# Patient Record
Sex: Male | Born: 1990
Health system: Southern US, Community
[De-identification: ages and names within clinical notes are randomized; demographics above are authoritative.]

---
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
## 2016-02-24 DIAGNOSIS — H5213 Myopia, bilateral: Secondary | ICD-10-CM | POA: Diagnosis not present

## 2016-04-30 DIAGNOSIS — F900 Attention-deficit hyperactivity disorder, predominantly inattentive type: Secondary | ICD-10-CM | POA: Diagnosis not present

## 2016-05-12 DIAGNOSIS — F900 Attention-deficit hyperactivity disorder, predominantly inattentive type: Secondary | ICD-10-CM | POA: Diagnosis not present

## 2016-06-10 DIAGNOSIS — F900 Attention-deficit hyperactivity disorder, predominantly inattentive type: Secondary | ICD-10-CM | POA: Diagnosis not present

## 2016-10-15 DIAGNOSIS — F900 Attention-deficit hyperactivity disorder, predominantly inattentive type: Secondary | ICD-10-CM | POA: Diagnosis not present

## 2017-04-27 DIAGNOSIS — F410 Panic disorder [episodic paroxysmal anxiety] without agoraphobia: Secondary | ICD-10-CM | POA: Diagnosis not present

## 2019-10-11 ENCOUNTER — Ambulatory Visit (HOSPITAL_COMMUNITY)
Admission: AD | Admit: 2019-10-11 | Discharge: 2019-10-11 | Disposition: A | Discharge status: Home / Self Care | Payer: Self-pay | Attending: Psychiatry | Admitting: Psychiatry

## 2019-10-12 ENCOUNTER — Other Ambulatory Visit: Payer: Self-pay

## 2019-10-12 ENCOUNTER — Ambulatory Visit (HOSPITAL_COMMUNITY)
Admission: EM | Admit: 2019-10-12 | Discharge: 2019-10-12 | Disposition: A | Discharge status: Home / Self Care | Payer: No Payment, Other | Attending: Family | Admitting: Family

## 2019-10-12 DIAGNOSIS — R45851 Suicidal ideations: Secondary | ICD-10-CM | POA: Insufficient documentation

## 2019-10-12 DIAGNOSIS — F4323 Adjustment disorder with mixed anxiety and depressed mood: Secondary | ICD-10-CM

## 2019-10-12 NOTE — H&P (Signed)
Behavioral Health Medical Screening Exam  Kori A Frericks is an 29 y.o. male.  Patient left prior to evaluation by TTS or provider.  Jackelyn Poling, NP 10/12/2019, 2:33 AM

## 2019-10-12 NOTE — ED Notes (Signed)
Pt belongings in locker 3.  

## 2019-10-12 NOTE — ED Notes (Signed)
Patient discharged home. Patient denies SI/HI at discharge. AVS reviewed with patient and written copy given to patient and he verbalized understanding. Patient belongings returned to him. Patient escorted to lobby.

## 2019-10-12 NOTE — BH Assessment (Signed)
Comprehensive Clinical Assessment (CCA) Note  10/12/2019 Grant Alvarez [id number]   Patient is a 29 year old male presenting voluntarily to [facility] for assessment. Patient reports increasing depression since he and his wife separated 2 days ago. Patient reports a history of pornography addiction that eventually led to an online sexual relationship with another woman. Patient's wife told him to leave and he moved back in with his parents. He reports current passive SI without any intent or plan. He denies HI/AVH, substance use, or trauma history. Patient states he feels safe to be discharged. He denies having access to weapons in the home. Patient gives verbal consent for TTS to contact his mother, Grant Alvarez, at [phone] for collateral information.  This counselor attempted to reach collateral but she did not answer. HIPPA compliant voicemail left.   Visit Diagnosis:   F43.21 Adjustment disorder, with depressed mood  Per Malachy Chamber, PMHNP this patient does not meet in patient criteria and is psych cleared for d/c. Patient referred to [facility] for outpatient therapy and psychiatry.   CCA Screening, Triage and Referral (STR)  Patient Reported Information How did you hear about [place]? Family/Friend  Referral name: No data recorded Referral phone number: No data recorded  Whom do you see for routine medical problems? I don't have a doctor   What Is the Reason for Your Visit/Call Today? dispair, not wanting to be alive  How Long Has This Been Causing You Problems? <Week  What Do You Feel Would Help You the Most Today? Medication;Therapy   Have You Recently Been in Any Inpatient Treatment (Hospital/Detox/Crisis Center/28-Day Program)? No  Name/Location of Program/Hospital:No data recorded How Long Were You There? No data recorded When Were You Discharged? No data recorded  Have You Ever Received Services From [facility] Before? No  Who Do You See at [facility]? No data  recorded  Have You Recently Had Any Thoughts About Hurting Yourself? Yes  Are You Planning to Commit Suicide/Harm Yourself At This time? No   Have you Recently Had Thoughts About Hurting Someone [name]? No  Explanation: No data recorded  Have You Used Any Alcohol or Drugs in the Past 24 Hours? No  How Long Ago Did You Use Drugs or Alcohol? No data recorded What Did You Use and How Much? No data recorded  Do You Currently Have a Therapist/Psychiatrist? Yes  Name of Therapist/Psychiatrist: Vesta Mixer in [place]   Have You Been Recently Discharged From Any Public relations account executive or Programs? No  Explanation of Discharge From Practice/Program: No data recorded    CCA Screening Triage Referral Assessment Type of Contact: Face-to-Face  Is this Initial or Reassessment? No data recorded Date Telepsych consult ordered in [facility]:  No data recorded Time Telepsych consult ordered in [facility]:  No data recorded  Patient Reported Information Reviewed? Yes  Patient Left Without Being Seen? No data recorded Reason for Not Completing Assessment: No data recorded  Collateral Involvement: No data recorded  Does Patient Have a Court Appointed Legal Guardian? No data recorded Name and Contact of Legal Guardian: No data recorded If Minor and Not Living with Parent(s), Who has Custody? No data recorded Is CPS involved or ever been involved? Never  Is APS involved or ever been involved? Never   Patient Determined To Be At Risk for Harm To Self or Others Based on Review of Patient Reported Information or Presenting Complaint? No  Method: No data recorded Availability of Means: No data recorded Intent: No data recorded Notification Required: No data recorded  Additional Information for Danger to Others Potential: No data recorded Additional Comments for Danger to Others Potential: No data recorded Are There Guns or Other Weapons in Your Home? No data recorded Types of Guns/Weapons: No data recorded Are  These Weapons Safely Secured?                            No data recorded Who Could Verify You Are Able To Have These Secured: No data recorded Do You Have any Outstanding Charges, Pending Court Dates, Parole/Probation? No data recorded Contacted To Inform of Risk of Harm To Self or Others: No data recorded  Location of Assessment: [facility]   Does Patient Present under Involuntary Commitment? No  IVC Papers Initial File Date: No data recorded  [place] of Residence: [place]   Patient Currently Receiving the Following Services: Medication Management   Determination of Need: Routine (7 days)   Options For Referral: Medication Management;Outpatient Therapy     CCA Biopsychosocial  Intake/Chief Complaint:  CCA Intake With Chief Complaint CCA Part Two Date: 10/12/19 CCA Part Two Time: 1715 Chief Complaint/Presenting Problem: NA Patient's Currently Reported Symptoms/Problems: NA Individual's Strengths: NA Individual's Preferences: NA Individual's Abilities: NA Type of Services Patient Feels Are Needed: NA Initial Clinical Notes/Concerns: NA  Mental Health Symptoms Depression:  Depression: Change in energy/activity, Difficulty Concentrating, Fatigue, Hopelessness, Increase/decrease in appetite, Irritability, Sleep (too much or little), Tearfulness, Weight gain/loss, Worthlessness, Duration of symptoms less than two weeks  Mania:  Mania: None  Anxiety:   Anxiety: None  Psychosis:  Psychosis: None  Trauma:  Trauma: None  Obsessions:  Obsessions: None  Compulsions:  Compulsions: None  Inattention:  Inattention: None  Hyperactivity/Impulsivity:  Hyperactivity/Impulsivity: N/A  Oppositional/Defiant Behaviors:  Oppositional/Defiant Behaviors: None  Emotional Irregularity:  Emotional Irregularity: None  Other Mood/Personality Symptoms:      Mental Status Exam Appearance and self-care  Stature:  Stature: Average  Weight:  Weight: Overweight  Clothing:   Clothing: Casual  Grooming:  Grooming: Normal  Cosmetic use:  Cosmetic Use: None  Posture/gait:  Posture/Gait: Slumped  Motor activity:  Motor Activity: Not Remarkable  Sensorium  Attention:  Attention: Normal  Concentration:  Concentration: Normal  Orientation:  Orientation: X5  Recall/memory:  Recall/Memory: Normal  Affect and Mood  Affect:  Affect: Depressed  Mood:  Mood: Depressed  Relating  Eye contact:  Eye Contact: Normal  Facial expression:  Facial Expression: Depressed  Attitude toward examiner:  Attitude Toward Examiner: Cooperative  Thought and Language  Speech flow: Speech Flow: Clear and Coherent  Thought content:  Thought Content: Appropriate to Mood and Circumstances  Preoccupation:  Preoccupations: Suicide  Hallucinations:  Hallucinations: None  Organization:     Company secretary of Knowledge:  Fund of Knowledge: Good  Intelligence:  Intelligence: Average  Abstraction:  Abstraction: Normal  Judgement:  Judgement: Impaired  Reality Testing:  Reality Testing: Distorted  Insight:  Insight: Fair  Decision Making:  Decision Making: Impulsive  Social Functioning  Social Maturity:  Social Maturity: Irresponsible  Social Judgement:  Social Judgement: Normal  Stress  Stressors:  Stressors: Family conflict, Grief/losses, Surveyor, quantity  Coping Ability:  Coping Ability: Normal  Skill Deficits:  Skill Deficits: Interpersonal  Supports:  Supports: Family, Friends/Service system     Religion: Religion/Spirituality Are You A Religious Person?: No  Leisure/Recreation: Leisure / Recreation Do You Have Hobbies?: No  Exercise/Diet: Exercise/Diet Do You Exercise?: No Have You Gained or Lost A Significant  Amount of Weight in the Past Six Months?: No Do You Follow a Special Diet?: No Do You Have Any Trouble Sleeping?: Yes Explanation of Sleeping Difficulties: reports prior to last night little to no sleep   CCA Employment/Education  Employment/Work  Situation: Employment / Work Situation Employment situation: Employed 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[employer] How long has patient been employed?: [employer] Patient's job has been impacted by current illness: No What is the longest time patient has a held a job?: [employer] Where was the patient employed at that time?: [employer]  Education: Education Is Patient Currently Attending School?: Yes School Currently Attending: [employer] Last Grade Completed: 12 Name of High School: [employer] Did Garment/textile technologistYou Graduate From [employer]?: Yes Did Theme park managerYou Attend College?: Yes What Type of College Degree Do you Have?: in school for paralegal degree Did You Attend Graduate School?: No Did You Have An Individualized Education Program (IIEP): No Did You Have Any Difficulty At School?: No Patient's Education Has Been Impacted by Current Illness: No   CCA Family/Childhood History  Family and Relationship History: Family history Marital status: Divorced Divorced, when?: 3 days ago What types of issues is patient dealing with in the relationship?: pornography addiction Additional relationship information: together 9 years Are you sexually active?: No What is your sexual orientation?: heterosexual Has your sexual activity been affected by drugs, alcohol, medication, or emotional stress?: NA Does patient have children?: No  Childhood History:  Childhood History By whom was/is the patient raised?: Both parents Additional childhood history information: parents divorced Description of patient's relationship with caregiver when they were a child: supportive Patient's description of current relationship with people who raised him/her: continue to be supportive, currently staying with his mother How were you disciplined when you got in trouble as a child/adolescent?: NA Does patient have siblings?: No Did patient suffer any verbal/emotional/physical/sexual abuse as a child?: No Did patient suffer from severe childhood  neglect?: No Has patient ever been sexually abused/assaulted/raped as an adolescent or adult?: No Was the patient ever a victim of a crime or a disaster?: No Witnessed domestic violence?: No Has patient been affected by domestic violence as an adult?: No  Child/Adolescent Assessment:     CCA Substance Use  Alcohol/Drug Use: Alcohol / Drug Use Pain Medications: see MAR Prescriptions: see MAR Over the Counter: see MAR History of alcohol / drug use?: No history of alcohol / drug abuse                         ASAM's:  Six Dimensions of Multidimensional Assessment  Dimension 1:  Acute Intoxication and/or Withdrawal Potential:      Dimension 2:  Biomedical Conditions and Complications:      Dimension 3:  Emotional, Behavioral, or Cognitive Conditions and Complications:     Dimension 4:  Readiness to Change:     Dimension 5:  Relapse, Continued use, or Continued Problem Potential:     Dimension 6:  Recovery/Living Environment:     ASAM Severity Score:    ASAM Recommended Level of Treatment:     Substance use Disorder (SUD)    Recommendations for Services/Supports/Treatments:    DSM5 Diagnoses: There are no problems to display for this patient.   Patient Centered Plan: Patient is on the following Treatment Plan(s):     Referrals to Alternative Service(s): Referred to Alternative Service(s):   Place:   Date:   Time:    Referred to Alternative Service(s):   Place:  Date:   Time:    Referred to Alternative Service(s):   Place:   Date:   Time:    Referred to Alternative Service(s):   Place:   Date:   Time:     Celedonio Miyamoto

## 2019-10-12 NOTE — ED Provider Notes (Signed)
Behavioral Health Medical Screening Exam  Grant Alvarez is a 29 y.o. male who presents with passive, fleeting thoughts of suicidal ideations. He reports recent seperation from his wife due to his porn addiction. He feels ashame and would like to work on his behaviors. He is currently under the care of a psych np at [facility], who is prescribing him Prozac 20mg  po daily. He is also taking Hydroxyzine 25mg  po TID prn for anxiety. He denies active suicidal thoughts, does not have a plan, never attempted suicide and never tried to harm himself. He is able to contract for safety, and feels as though he can still trust himself to not do any harm. He feels outpatient will better suit him.   Total Time spent with patient: 30 minutes  Psychiatric Specialty Exam  Presentation  General Appearance:Appropriate for Environment;Fairly Groomed  Eye Contact:Minimal  Speech:Clear and Coherent;Normal Rate  Speech Volume:Normal  Handedness:Right   Mood and Affect  Mood:Depressed  Affect:Appropriate;Congruent   Thought Process  Thought Processes:Coherent  Descriptions of Associations:Intact  Orientation:Full (Time, Place and Person)  Thought Content:Logical  Hallucinations:None  Ideas of Reference:None  Suicidal Thoughts:No  Homicidal Thoughts:No   Sensorium  Memory:Immediate Fair;Recent Fair;Remote Fair  Judgment:Fair  Insight:Fair   Executive Functions  Concentration:Fair  Attention Span:Fair  Recall:Good  Fund of Knowledge:Good  Language:Good   Psychomotor Activity  Psychomotor Activity:Normal   Assets  Assets:Desire for Improvement;Communication Skills;Housing;Physical Health;Resilience   Sleep  Sleep:Fair  Number of hours: No data recorded  Physical Exam: Physical Exam ROS Blood pressure (!) 134/94, pulse 90, temperature 97.7 F (36.5 C), temperature source Tympanic, resp. rate 18, height 5\' 11"  (1.803 m), weight 215 lb (97.5 kg), SpO2 98 %. Body  mass index is 29.99 kg/m.  Musculoskeletal: Strength & Muscle Tone: within normal limits Gait & Station: normal Patient leans: Right   Recommendations:  Based on my evaluation the patient does not appear to have an emergency medical condition. WIll discharg home at this time. Information included for [facility] as their offices are now closed at this time.  , FNP 10/12/2019, 5:49 PM

## 2019-10-12 NOTE — Discharge Instructions (Signed)
Patient referred to [facility] for outpatient therapy and psychiatry.

## 2019-11-07 ENCOUNTER — Emergency Department (HOSPITAL_BASED_OUTPATIENT_CLINIC_OR_DEPARTMENT_OTHER)
Admission: EM | Admit: 2019-11-07 | Discharge: 2019-11-07 | Disposition: A | Discharge status: Home / Self Care | Payer: No Typology Code available for payment source | Attending: Emergency Medicine | Admitting: Emergency Medicine

## 2019-11-07 ENCOUNTER — Emergency Department (HOSPITAL_BASED_OUTPATIENT_CLINIC_OR_DEPARTMENT_OTHER): Payer: No Typology Code available for payment source

## 2019-11-07 ENCOUNTER — Other Ambulatory Visit: Payer: Self-pay

## 2019-11-07 ENCOUNTER — Encounter (HOSPITAL_BASED_OUTPATIENT_CLINIC_OR_DEPARTMENT_OTHER): Payer: Self-pay

## 2019-11-07 DIAGNOSIS — W010XXA Fall on same level from slipping, tripping and stumbling without subsequent striking against object, initial encounter: Secondary | ICD-10-CM | POA: Diagnosis not present

## 2019-11-07 DIAGNOSIS — S43014A Anterior dislocation of right humerus, initial encounter: Secondary | ICD-10-CM | POA: Diagnosis not present

## 2019-11-07 DIAGNOSIS — S4991XA Unspecified injury of right shoulder and upper arm, initial encounter: Secondary | ICD-10-CM | POA: Diagnosis present

## 2019-11-07 MED ORDER — PROPOFOL 10 MG/ML IV BOLUS
0.5000 mg/kg | Freq: Once | INTRAVENOUS | Status: AC
  Administered 2019-11-07: 60 mg via INTRAVENOUS
  Filled 2019-11-07: qty 20

## 2019-11-07 NOTE — ED Notes (Signed)
Pt alert, shoulder immobilizer in place. Skin warm and dry, taking po water. Family member at bedside.

## 2019-11-07 NOTE — ED Provider Notes (Signed)
[facility] EMERGENCY DEPARTMENT Provider Note   CSN: [id number] Arrival date & time: 11/07/19  1557     History Chief Complaint  Patient presents with  . Shoulder Injury    Grant Alvarez is a 29 y.o. male.  Patient c/o slip and near fall ~ 1 hr ago, with injury to right shoulder. States floor was wet and slippery, and was about to fall, tried to reach out right arm to brace on counter, and felt pain in right shoulder. Denies prior right shoulder injury. Pain acute onset, constant, moderate, persistent, worse w movement, non radiating. Denies any other pain or injury. No elbow or wrist pain. No head injury or headache. No neck or back pain. No cp or sob. No abd pain. Skin is intact.  No associated numbness or weakness. Right hand dom.   The history is provided by the patient.  Shoulder Injury Pertinent negatives include no chest pain, no abdominal pain, no headaches and no shortness of breath.       History reviewed. No pertinent past medical history.  There are no problems to display for this patient.   History reviewed. No pertinent surgical history.     No family history on file.  Social History   Tobacco Use  . Smoking status: Never Smoker  . Smokeless tobacco: Never Used  Vaping Use  . Vaping Use: Never used  Substance Use Topics  . Alcohol use: Not Currently  . Drug use: Never    Home Medications Prior to Admission medications   Not on File    Allergies    Sulfa antibiotics  Review of Systems   Review of Systems  Constitutional: Negative for fever.  HENT: Negative for nosebleeds.   Eyes: Negative for redness.  Respiratory: Negative for shortness of breath.   Cardiovascular: Negative for chest pain.  Gastrointestinal: Negative for abdominal pain.  Genitourinary: Negative for flank pain.  Musculoskeletal: Negative for back pain and neck pain.  Skin: Negative for wound.  Neurological: Negative for headaches.  Hematological: Does  not bruise/bleed easily.  Psychiatric/Behavioral: Negative for confusion.    Physical Exam Updated Vital Signs BP (!) 143/100 (BP Location: Left Arm)   Pulse 81   Resp 20   Ht 1.803 m (5\' 11" )   Wt 93 kg   SpO2 99%   BMI 28.59 kg/m   Physical Exam Vitals and nursing note reviewed.  Constitutional:      Appearance: Normal appearance. He is well-developed.  HENT:     Head: Atraumatic.     Nose: Nose normal.     Mouth/Throat:     Mouth: Mucous membranes are moist.  Eyes:     General: No scleral icterus.    Conjunctiva/sclera: Conjunctivae normal.  Neck:     Trachea: No tracheal deviation.  Cardiovascular:     Rate and Rhythm: Normal rate.     Pulses: Normal pulses.  Pulmonary:     Effort: Pulmonary effort is normal. No accessory muscle usage or respiratory distress.  Chest:     Chest wall: No tenderness.  Abdominal:     General: There is no distension.     Palpations: Abdomen is soft.     Tenderness: There is no abdominal tenderness.  Genitourinary:    Comments: No cva tenderness. Musculoskeletal:        General: No swelling.     Cervical back: Neck supple. No tenderness.     Comments: CTLS spine, non tender, aligned, no step off. Tenderness right  shoulder. No other focal extremity pain or tenderness. Distal pulses palp, radial pulse 2+. No tenderness at right elbow. No significant sts to arm noted.   Skin:    General: Skin is warm and dry.     Findings: No rash.  Neurological:     Mental Status: He is alert.     Comments: Alert, speech clear. Motor/sens grossly intact. RUE, rad/med/uln fxn, motor and sens intact. Steady gait.   Psychiatric:        Mood and Affect: Mood normal.     ED Results / Procedures / Treatments   Labs (all labs ordered are listed, but only abnormal results are displayed) Labs Reviewed - No data to display  EKG None  Radiology DG Shoulder Right  Result Date: 11/07/2019 CLINICAL DATA:  Fall.  Shoulder dislocation. EXAM: RIGHT  SHOULDER - 2+ VIEW COMPARISON:  None. FINDINGS: AP and scapular views demonstrate anterior inferior dislocation of the humeral head. No complicating fracture identified. IMPRESSION: Anterior inferior dislocation of the right glenohumeral joint. Electronically Signed   By: Jeronimo Greaves M.D.   On: 11/07/2019 16:54   DG Shoulder Right Portable  Result Date: 11/07/2019 CLINICAL DATA:  Postreduction EXAM: PORTABLE RIGHT SHOULDER COMPARISON:  Right shoulder radiographs from earlier today FINDINGS: No residual malalignment at the right glenohumeral joint. No evidence of acromioclavicular separation. No focal osseous lesions. Probable small Hill-Sachs deformity posteriorly in the right humeral head. Otherwise no fracture. No significant arthropathy. No radiopaque foreign body. IMPRESSION: No residual malalignment at the right glenohumeral joint. Probable small Hill-Sachs deformity in the posterior right humeral head. Electronically Signed   By: Delbert Phenix M.D.   On: 11/07/2019 18:32    Procedures .Sedation  Date/Time: 11/07/2019 6:37 PM Performed by: Cathren Laine, MD Authorized by: Cathren Laine, MD   Consent:    Consent obtained:  Verbal and written   Consent given by:  Patient Universal protocol:    Procedure explained and questions answered to patient or proxy's satisfaction: yes     Imaging studies available: yes     Immediately prior to procedure a time out was called: yes     Patient identity confirmation method:  Arm band and verbally with patient Indications:    Procedure performed:  Dislocation reduction   Procedure necessitating sedation performed by:  Physician performing sedation Pre-sedation assessment:    Time since last food or drink:  4 hours   ASA classification: class 1 - normal, healthy patient     Mallampati score:  I - soft palate, uvula, fauces, pillars visible   Pre-sedation assessments completed and reviewed: airway patency, cardiovascular function, hydration status,  mental status, nausea/vomiting, pain level, respiratory function and temperature     Pre-sedation assessment completed:  11/07/2019 5:00 PM Immediate pre-procedure details:    Reassessment: Patient reassessed immediately prior to procedure     Reviewed: vital signs, relevant labs/tests and NPO status     Verified: bag valve mask available, emergency equipment available, intubation equipment available, IV patency confirmed and oxygen available   Procedure details (see MAR for exact dosages):    Preoxygenation:  Nasal cannula   Sedation:  Propofol   Intended level of sedation: deep   Intra-procedure monitoring:  Blood pressure monitoring, cardiac monitor, continuous capnometry, continuous pulse oximetry, frequent LOC assessments and frequent vital sign checks   Intra-procedure events: none     Total Provider sedation time (minutes):  20 Post-procedure details:    Post-sedation assessment completed:  11/07/2019 6:39 PM   Attendance:  Constant attendance by certified staff until patient recovered     Recovery: Patient returned to pre-procedure baseline     Post-sedation assessments completed and reviewed: airway patency, cardiovascular function, hydration status, mental status, nausea/vomiting, pain level and respiratory function     Patient is stable for discharge or admission: yes     Patient tolerance:  Tolerated well, no immediate complications .Ortho Injury Treatment  Date/Time: 11/07/2019 6:40 PM Performed by: Cathren Laine, MD Authorized by: Cathren Laine, MD   Consent:    Consent obtained:  Verbal and written Universal protocol:    Procedure explained and questions answered to patient or proxy's satisfaction: yes     Immediately prior to procedure a time out was called: yes     Patient identity confirmed:  Arm band and verbally with patientInjury location: shoulder Location details: right shoulder Injury type: dislocation Dislocation type: anterior Hill-Sachs deformity:  yes Chronicity: new Pre-procedure neurovascular assessment: neurovascularly intact Pre-procedure distal perfusion: normal Pre-procedure neurological function: normal Pre-procedure range of motion: normal  Patient sedated: Yes. Refer to sedation procedure documentation for details of sedation. Manipulation performed: yes Reduction method: RUE slowly raised above head and then gently lower back into positions with reduction. Reduction successful: yes X-ray confirmed reduction: yes Immobilization: sling Post-procedure neurovascular assessment: post-procedure neurovascularly intact Post-procedure distal perfusion: normal Post-procedure neurological function: normal Post-procedure range of motion: normal Patient tolerance: patient tolerated the procedure well with no immediate complications    (including critical care time)  Medications Ordered in ED Medications - No data to display  ED Course  I have reviewed the triage vital signs and the nursing notes.  Pertinent labs & imaging results that were available during my care of the patient were reviewed by me and considered in my medical decision making (see chart for details).    MDM Rules/Calculators/A&P                         Xrays.   Reviewed nursing notes and prior charts for additional history.   Xrays reviewed/interpreted by me - +dislocation. Discussed w pt.   Procedural sedation with propofol. Time out performed.   Successful reduction of shoulder dislocation.   Recheck, pain controlled, radial pulse 2+, RUE nvi. Sling/immobilizer applied.   Patient fully awake and alert.   Pt appears stable for d/c.   Return precautions provided.      Final Clinical Impression(s) / ED Diagnoses Final diagnoses:  None    Rx / DC Orders ED Discharge Orders    None       Cathren Laine, MD 11/07/19 1844

## 2019-11-07 NOTE — Discharge Instructions (Signed)
It was our pleasure to provide your ER care today - we hope that you feel better.  Wear shoulder sling/immobilizer. Icepack/cold to sore area.   Take acetaminophen or ibuprofen as need for pain.   No driving for the next 8 hours.   Follow up with orthopedist in 1 week - call office tomorrow to arrange appointment.   Return to ER if worse, new symptoms, new or severe pain, numbness/weakness, or other concern.

## 2019-11-07 NOTE — ED Triage Notes (Signed)
Pt fell/injured right shoulder ~245pm

## 2022-01-20 ENCOUNTER — Other Ambulatory Visit: Payer: Self-pay

## 2022-01-20 ENCOUNTER — Emergency Department (HOSPITAL_BASED_OUTPATIENT_CLINIC_OR_DEPARTMENT_OTHER)
Admission: EM | Admit: 2022-01-20 | Discharge: 2022-01-20 | Disposition: A | Discharge status: Home / Self Care | Payer: Managed Care, Other (non HMO) | Attending: Emergency Medicine | Admitting: Emergency Medicine

## 2022-01-20 ENCOUNTER — Encounter (HOSPITAL_BASED_OUTPATIENT_CLINIC_OR_DEPARTMENT_OTHER): Payer: Self-pay

## 2022-01-20 DIAGNOSIS — S199XXA Unspecified injury of neck, initial encounter: Secondary | ICD-10-CM | POA: Diagnosis present

## 2022-01-20 DIAGNOSIS — R42 Dizziness and giddiness: Secondary | ICD-10-CM | POA: Diagnosis not present

## 2022-01-20 DIAGNOSIS — S46812A Strain of other muscles, fascia and tendons at shoulder and upper arm level, left arm, initial encounter: Secondary | ICD-10-CM | POA: Insufficient documentation

## 2022-01-20 DIAGNOSIS — X500XXA Overexertion from strenuous movement or load, initial encounter: Secondary | ICD-10-CM | POA: Diagnosis not present

## 2022-01-20 DIAGNOSIS — Y990 Civilian activity done for income or pay: Secondary | ICD-10-CM | POA: Diagnosis not present

## 2022-01-20 LAB — BASIC METABOLIC PANEL
Anion gap: 11 (ref 5–15)
BUN: 15 mg/dL (ref 6–20)
CO2: 23 mmol/L (ref 22–32)
Calcium: 9.4 mg/dL (ref 8.9–10.3)
Chloride: 105 mmol/L (ref 98–111)
Creatinine, Ser: 0.97 mg/dL (ref 0.61–1.24)
GFR, Estimated: >60 mL/min (ref >60)
Glucose, Bld: 121 mg/dL — ABNORMAL HIGH (ref 70–99)
Potassium: 4.4 mmol/L (ref 3.5–5.1)
Sodium: 139 mmol/L (ref 135–145)

## 2022-01-20 LAB — CBC WITH DIFFERENTIAL/PLATELET
Abs Immature Granulocytes: 0.02 10*3/uL (ref 0.00–0.07)
Basophils Absolute: 0 10*3/uL (ref 0.0–0.1)
Basophils Relative: 0 %
Eosinophils Absolute: 0 10*3/uL (ref 0.0–0.5)
Eosinophils Relative: 0 %
HCT: 45.2 % (ref 39.0–52.0)
Hemoglobin: 15.8 g/dL (ref 13.0–17.0)
Immature Granulocytes: 0 %
Lymphocytes Relative: 11 %
Lymphs Abs: 1.1 10*3/uL (ref 0.7–4.0)
MCH: 28.8 pg (ref 26.0–34.0)
MCHC: 35 g/dL (ref 30.0–36.0)
MCV: 82.5 fL (ref 80.0–100.0)
Monocytes Absolute: 0.3 10*3/uL (ref 0.1–1.0)
Monocytes Relative: 3 %
Neutro Abs: 8.6 10*3/uL — ABNORMAL HIGH (ref 1.7–7.7)
Neutrophils Relative %: 86 %
Platelets: 283 10*3/uL (ref 150–400)
RBC: 5.48 MIL/uL (ref 4.22–5.81)
RDW: 11.6 % (ref 11.5–15.5)
WBC: 10.1 10*3/uL (ref 4.0–10.5)
nRBC: 0 % (ref 0.0–0.2)

## 2022-01-20 MED ORDER — LIDOCAINE 5 % EX PTCH
1.0000 | MEDICATED_PATCH | Freq: Once | CUTANEOUS | Status: DC
  Administered 2022-01-20: 1 via TRANSDERMAL
  Filled 2022-01-20: qty 1

## 2022-01-20 MED ORDER — LIDOCAINE 5 % EX PTCH: 1.0000 | MEDICATED_PATCH | CUTANEOUS | 0 refills | Status: AC

## 2022-01-20 MED ORDER — IBUPROFEN 400 MG PO TABS
600.0000 mg | ORAL_TABLET | Freq: Once | ORAL | Status: DC
  Filled 2022-01-20: qty 1

## 2022-01-20 MED ORDER — SODIUM CHLORIDE 0.9 % IV BOLUS
500.0000 mL | Freq: Once | INTRAVENOUS | Status: AC
  Administered 2022-01-20: 500 mL via INTRAVENOUS

## 2022-01-20 NOTE — Discharge Instructions (Signed)
It was a pleasure taking care of you today!  Your labs didn't show any concerning findings today. You may take over the counter 600 mg ibuprofen and alternate with 500 mg tylenol every 6 hours as needed for pain. Place heat to the affected area up to 15 minutes at a time, ensure to place a barrier between your skin and the heat. You will be sent a prescription for lidoderm patch, use as directed. You may follow up with your primary care provider as needed. Return to the ED if you are experiencing increasing/worsening symptoms.

## 2022-01-20 NOTE — ED Notes (Signed)
Pt did not want warm blanket

## 2022-01-20 NOTE — ED Provider Notes (Signed)
[facility] Provider Note   CSN: [id number] Arrival date & time: 01/20/22  1001     History  Chief Complaint  Patient presents with   Neck Pain   Dizziness    Grant Alvarez is a 31 y.o. male who presents to the ED with concerns for neck pain onset 5 days. Notes that he woke up with neck pain worse with movement. Has tried hot shower, massages. Was in the shower today when he felt dizzy and passed out in the shower. Was evaluated at [facility] and told to come into the ED for his symptoms. Tried tylenol and ibuprofen for his symptoms. Denies recent injury, trauma, fall. Denies hitting his head, nausea, vomiting, dizziness, lightheadedness, numbness, tingling, weakness, chest pain, shortness of breath, headache, vision changes.   The history is provided by the patient. No language interpreter was used.       Home Medications Prior to Admission medications   Medication Sig Start Date End Date Taking? Authorizing Provider  lidocaine (LIDODERM) 5 % Place 1 patch onto the skin daily. Remove & Discard patch within 12 hours or as directed by MD 01/20/22  Yes Ramez Arrona A, PA-C      Allergies    Sulfa antibiotics    Review of Systems   Review of Systems  Musculoskeletal:  Positive for neck pain.  Neurological:  Positive for dizziness.  All other systems reviewed and are negative.   Physical Exam Updated Vital Signs BP 129/89   Pulse 78   Temp 97.8 F (36.6 C) (Oral)   Resp 16   Ht 5\' 11"  (1.803 m)   Wt 99.8 kg   SpO2 98%   BMI 30.68 kg/m  Physical Exam Vitals and nursing note reviewed.  Constitutional:      General: He is not in acute distress.    Appearance: He is not diaphoretic.  HENT:     Head: Normocephalic and atraumatic.     Mouth/Throat:     Pharynx: No oropharyngeal exudate.  Eyes:     General: No scleral icterus.    Conjunctiva/sclera: Conjunctivae normal.  Cardiovascular:     Rate and Rhythm: Normal rate and regular  rhythm.     Pulses: Normal pulses.     Heart sounds: Normal heart sounds.  Pulmonary:     Effort: Pulmonary effort is normal. No respiratory distress.     Breath sounds: Normal breath sounds. No wheezing.  Abdominal:     General: Bowel sounds are normal.     Palpations: Abdomen is soft. There is no mass.     Tenderness: There is no abdominal tenderness. There is no guarding or rebound.  Musculoskeletal:        General: Normal range of motion.     Cervical back: Normal range of motion and neck supple.     Comments: Tenderness to palpation noted to left upper trapezius.  No spinal tenderness to palpation.  No tenderness to palpation noted to the paraspinal muscles of the cervical spine. Full ROM of neck without difficulty.   Skin:    General: Skin is warm and dry.  Neurological:     Mental Status: He is alert.  Psychiatric:        Behavior: Behavior normal.     ED Results / Procedures / Treatments   Labs (all labs ordered are listed, but only abnormal results are displayed) Labs Reviewed  CBC WITH DIFFERENTIAL/PLATELET - Abnormal; Notable for the following components:  Result Value   Neutro Abs 8.6 (*)    All other components within normal limits  BASIC METABOLIC PANEL - Abnormal; Notable for the following components:   Glucose, Bld 121 (*)    All other components within normal limits  CBG MONITORING, ED    EKG EKG Interpretation  Date/Time:  Wednesday January 20 2022 12:51:18 EST Ventricular Rate:  76 PR Interval:  158 QRS Duration: 90 QT Interval:  362 QTC Calculation: 407 R Axis:   16 Text Interpretation: Sinus rhythm Borderline T abnormalities, inferior leads Borderline ST elevation, lateral leads No previous tracing Confirmed by Linwood Dibbles [phone]) on 01/20/2022 1:06:08 PM  Radiology No results found.  Procedures Procedures    Medications Ordered in ED Medications  lidocaine (LIDODERM) 5 % 1 patch (1 patch Transdermal Patch Applied 01/20/22 1213)   ibuprofen (ADVIL) tablet 600 mg (0 mg Oral Hold 01/20/22 1212)  sodium chloride 0.9 % bolus 500 mL (0 mLs Intravenous Stopped 01/20/22 1217)    ED Course/ Medical Decision Making/ A&P Clinical Course as of 01/20/22 1339  Wed Jan 20, 2022  1153 Re-evaluated and noted improvement of symptoms with treatment regimen in the ED.  Discussed with patient lab findings.  Discussed with patient discharge treatment plan.  Answered all available questions.  Patient appears safe to discharge at this time. [SB]    Clinical Course User Index [SB] Staphany Ditton A, PA-C                           Medical Decision Making Amount and/or Complexity of Data Reviewed Labs: ordered.  Risk Prescription drug management.   Pt presents with concerns for neck pain onset 5 days. Lifts heavy items at work. Also had a syncopal episode PTA secondary to pain. Vital signs, pt afebrile. On exam, pt with Tenderness to palpation noted to left upper trapezius.  No spinal tenderness to palpation.  No tenderness to palpation noted to the paraspinal muscles of the cervical spine. Full ROM of neck without difficulty. No acute cardiovascular, respiratory exam findings. Differential diagnosis includes anemia, electrolyte abnormality, arrhythmia, vasovagal syncope, fracture, dislocation, muscle strain.    Additional history obtained:  Additional history obtained from Spouse/Significant Other  Labs:  I ordered, and personally interpreted labs.  The pertinent results include:   CBC unremarkable BMP unremarkable  Medications:  I ordered medication including IVF, Lidoderm patch, and Warm compress for symptom management Reevaluation of the patient after these medicines and interventions, I reevaluated the patient and found that they have improved I have reviewed the patients home medicines and have made adjustments as needed   Disposition: Presentation suspicious for likely strain of trapezius muscle. Also suspicious for  vasovagal syncope. Doubt concerns for anemia, electrolyte abnormality, hypoglycemia, arrhythmia at this time. After consideration of the diagnostic results and the patients response to treatment, I feel that the patient would benefit from Discharge home. Work note provided. Lidoderm patch sent to pharmacy. Supportive care measures and strict return precautions discussed with patient at bedside. Pt acknowledges and verbalizes understanding. Pt appears safe for discharge. Follow up as indicated in discharge paperwork.    This chart was dictated using voice recognition software, Dragon. Despite the best efforts of this provider to proofread and correct errors, errors may still occur which can change documentation meaning.   Final Clinical Impression(s) / ED Diagnoses Final diagnoses:  Strain of trapezius muscle, left, initial encounter    Rx / DC Orders ED Discharge Orders  Ordered    lidocaine (LIDODERM) 5 %  Every 24 hours        01/20/22 1323              Shatina Streets A, PA-C 01/20/22 1342    Dorie Rank, MD 01/21/22 [phone]

## 2022-01-20 NOTE — ED Triage Notes (Signed)
Pt awoke Friday with neck/upper back pain, worse with movement.  This morning increased pain, tried hot shower and massaging area, but felt dizzy, passed out in shower.    Went to urgent care today, sent him to ED. Denies n/v, denies hitting head.  Took tylenol at 3am, Ibuprofen at 9am.  Ambulatory in triage with steady gait

## 2022-04-03 IMAGING — DX DG SHOULDER 2+V PORT*R*
2 series · 2 of 2 positions shown · non-contrast
Comparison: Right shoulder radiographs from earlier today

CLINICAL DATA: Postreduction

EXAM:
PORTABLE RIGHT SHOULDER

[shoulder obl (1 of 2)]
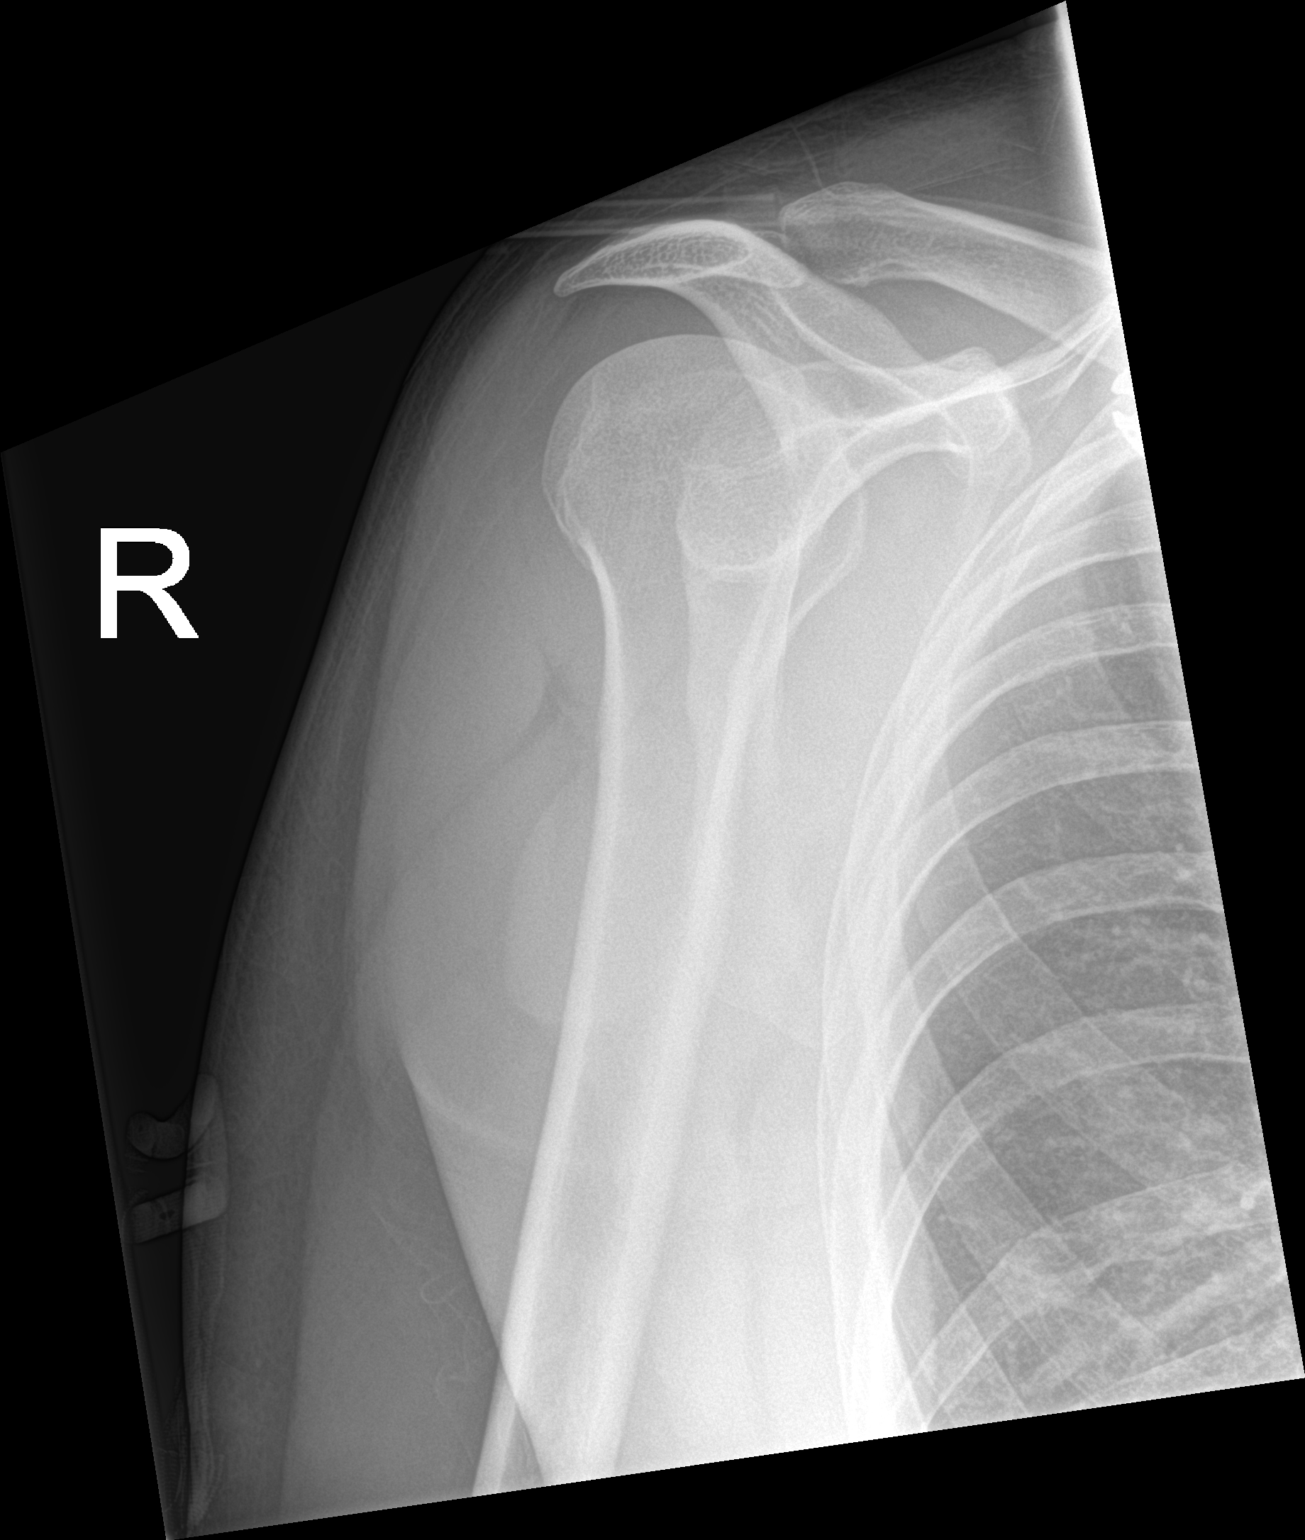

[shoulder obl (2 of 2)]
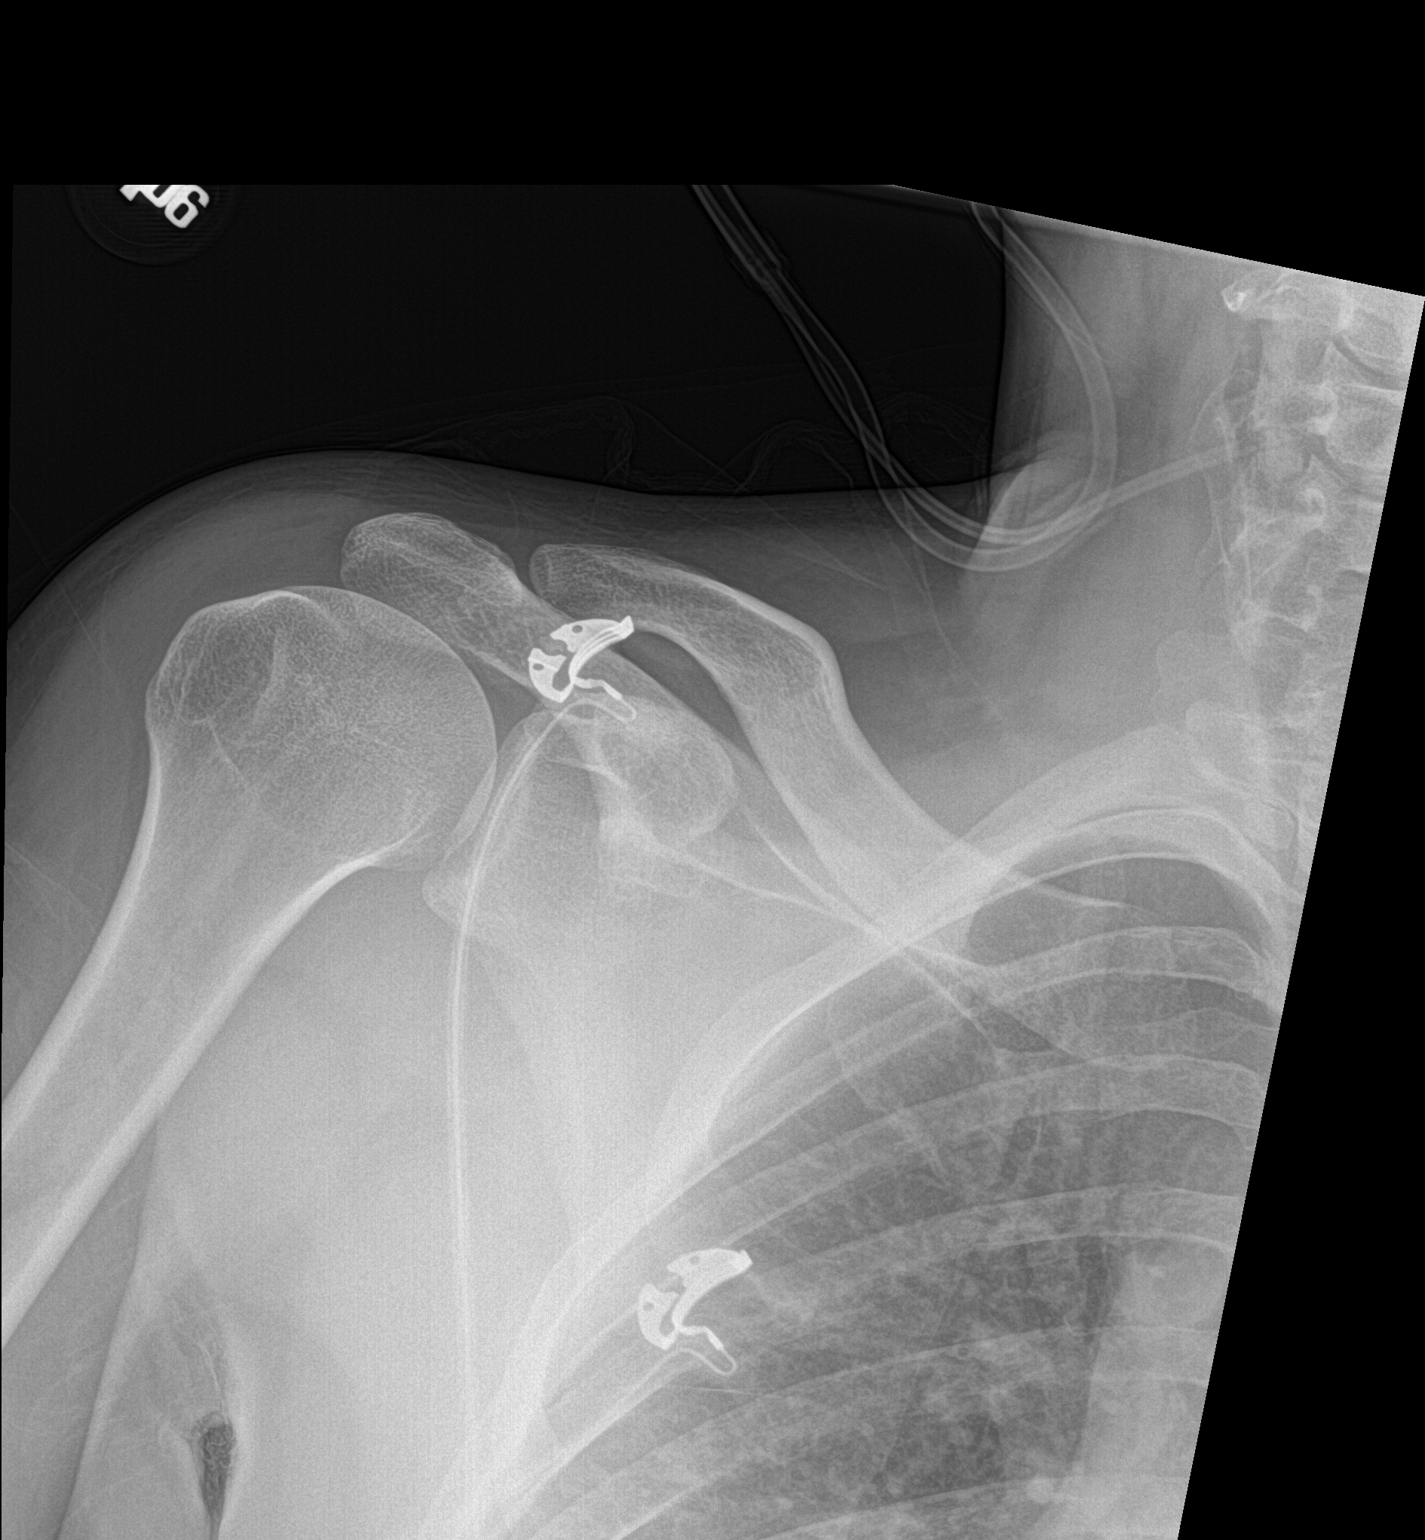

[2 of 2 positions shown; findings below may reference images not displayed]

FINDINGS: No residual malalignment at the right glenohumeral joint. No
evidence of acromioclavicular separation. No focal osseous lesions.
Probable small Hill-Sachs deformity posteriorly in the right humeral
head. Otherwise no fracture. No significant arthropathy. No
radiopaque foreign body.
IMPRESSION: No residual malalignment at the right glenohumeral joint. Probable
small Hill-Sachs deformity in the posterior right humeral head.
